# Patient Record
Sex: Male | Born: 2000 | Race: White | Hispanic: No | Marital: Single | Smoking: Never smoker
Health system: Southern US, Community
[De-identification: ages and names within clinical notes are randomized; demographics above are authoritative.]

## PROBLEM LIST (undated history)

## (undated) DIAGNOSIS — F913 Oppositional defiant disorder: Secondary | ICD-10-CM

## (undated) DIAGNOSIS — F988 Other specified behavioral and emotional disorders with onset usually occurring in childhood and adolescence: Secondary | ICD-10-CM

## (undated) HISTORY — DX: Other specified behavioral and emotional disorders with onset usually occurring in childhood and adolescence: F98.8

## (undated) HISTORY — DX: Oppositional defiant disorder: F91.3

---
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
## 2019-05-03 DIAGNOSIS — H5213 Myopia, bilateral: Secondary | ICD-10-CM | POA: Diagnosis not present

## 2019-08-21 DIAGNOSIS — H1045 Other chronic allergic conjunctivitis: Secondary | ICD-10-CM | POA: Diagnosis not present

## 2019-09-07 ENCOUNTER — Ambulatory Visit (INDEPENDENT_AMBULATORY_CARE_PROVIDER_SITE_OTHER): Payer: Medicaid Other | Admitting: Nurse Practitioner

## 2019-09-07 ENCOUNTER — Other Ambulatory Visit: Payer: Self-pay

## 2019-09-07 ENCOUNTER — Encounter: Payer: Self-pay | Admitting: Nurse Practitioner

## 2019-09-07 VITALS — BP 132/77 | HR 72 | Temp 98.2°F | Resp 20 | Ht 64.0 in | Wt 150.0 lb

## 2019-09-07 DIAGNOSIS — K4090 Unilateral inguinal hernia, without obstruction or gangrene, not specified as recurrent: Secondary | ICD-10-CM

## 2019-09-07 NOTE — Progress Notes (Signed)
   Subjective:    Patient ID: Samuel Evans, male    DOB: 2000/10/09, 19 y.o.   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 Now has a bulge in right groin area.   Review of Systems  Constitutional: Negative for diaphoresis.  Eyes: Negative for pain.  Respiratory: Negative for shortness of breath.   Cardiovascular: Negative for chest pain, palpitations and leg swelling.  Gastrointestinal: Negative for abdominal pain.  Endocrine: Negative for polydipsia.  Skin: Negative for rash.  Neurological: Negative for dizziness, weakness and headaches.  Hematological: Does not bruise/bleed easily.  All other systems reviewed and are negative.      Objective:   Physical Exam Vitals and nursing note reviewed.  Constitutional:      Appearance: Normal appearance.  Cardiovascular:     Rate and Rhythm: Normal rate and regular rhythm.     Heart sounds: Normal heart sounds.  Pulmonary:     Breath sounds: Normal breath sounds.  Genitourinary:    Comments: Right testicle enlargement  Non reducible right inguinal hernia. Skin:    General: Skin is warm.  Neurological:     General: No focal deficit present.     Mental Status: He is alert and oriented to person, place, and time.  Psychiatric:        Mood and Affect: Mood normal.        Behavior: Behavior normal.    BP 132/77   Pulse 72   Temp 98.2 F (36.8 C) (Temporal)   Resp 20   Ht 5\' 4"  (1.626 m)   Wt 150 lb (68 kg)   SpO2 99%   BMI 25.75 kg/m         Assessment & Plan:  Samuel Evans in today with chief complaint of Possible hernia in groin area   1. Right inguinal hernia If starts hurting to the ER ASAP No heavy lifitng    The above assessment and management plan was discussed with the patient. The patient verbalized understanding of and has agreed to the management plan. Patient  is aware to call the clinic if symptoms persist or worsen. Patient is aware when to return to the clinic for a follow-up visit. Patient educated on when it is appropriate to go to the emergency department.   Mary-Margaret Sonnie Alamo, FNP

## 2019-09-07 NOTE — Patient Instructions (Signed)
° °Inguinal Hernia, Adult °An inguinal hernia is when fat or your intestines push through a weak spot in a muscle where your leg meets your lower belly (groin). This causes a rounded lump (bulge). This kind of hernia could also be: °· In your scrotum, if you are male. °· In folds of skin around your vagina, if you are male. °There are three types of inguinal hernias. These include: °· Hernias that can be pushed back into the belly (are reducible). This type rarely causes pain. °· Hernias that cannot be pushed back into the belly (are incarcerated). °· Hernias that cannot be pushed back into the belly and lose their blood supply (are strangulated). This type needs emergency surgery. °If you do not have symptoms, you may not need treatment. If you have symptoms or a large hernia, you may need surgery. °Follow these instructions at home: °Lifestyle °· Do these things if told by your doctor so you do not have trouble pooping (constipation): °? Drink enough fluid to keep your pee (urine) pale yellow. °? Eat foods that have a lot of fiber. These include fresh fruits and vegetables, whole grains, and beans. °? Limit foods that are high in fat and processed sugars. These include foods that are fried or sweet. °? Take medicine for trouble pooping. °· Avoid lifting heavy objects. °· Avoid standing for long amounts of time. °· Do not use any products that contain nicotine or tobacco. These include cigarettes and e-cigarettes. If you need help quitting, ask your doctor. °· Stay at a healthy weight. °General instructions °· You may try to push your hernia in by very gently pressing on it when you are lying down. Do not try to force the bulge back in if it will not push in easily. °· Watch your hernia for any changes in shape, size, or color. Tell your doctor if you see any changes. °· Take over-the-counter and prescription medicines only as told by your doctor. °· Keep all follow-up visits as told by your doctor. This is  important. °Contact a doctor if: °· You have a fever. °· You have new symptoms. °· Your symptoms get worse. °Get help right away if: °· The area where your leg meets your lower belly has: °? Pain that gets worse suddenly. °? A bulge that gets bigger suddenly, and it does not get smaller after that. °? A bulge that turns red or purple. °? A bulge that is painful when you touch it. °· You are a man, and your scrotum: °? Suddenly feels painful. °? Suddenly changes in size. °· You cannot push the hernia in by very gently pressing on it when you are lying down. Do not try to force the bulge back in if it will not push in easily. °· You feel sick to your stomach (nauseous), and that feeling does not go away. °· You throw up (vomit), and that keeps happening. °· You have a fast heartbeat. °· You cannot poop (have a bowel movement) or pass gas. °These symptoms may be an emergency. Do not wait to see if the symptoms will go away. Get medical help right away. Call your local emergency services (911 in the [place] °Summary °· An inguinal hernia is when fat or your intestines push through a weak spot in a muscle where your leg meets your lower belly (groin). This causes a rounded lump (bulge). °· If you do not have symptoms, you may not need treatment. If you have symptoms or a large hernia,   you may need surgery. °· Avoid lifting heavy objects. Also avoid standing for long amounts of time. °· Do not try to force the bulge back in if it will not push in easily. °This information is not intended to replace advice given to you by your health care provider. Make sure you discuss any questions you have with your health care provider. °Document Revised: 03/30/2017 Document Reviewed: 11/28/2016 °Elsevier Patient Education © 2020 [employer] ° °

## 2019-09-10 ENCOUNTER — Other Ambulatory Visit: Payer: Self-pay | Admitting: Nurse Practitioner

## 2019-09-10 ENCOUNTER — Ambulatory Visit: Payer: Self-pay | Admitting: General Surgery

## 2019-09-10 DIAGNOSIS — K4090 Unilateral inguinal hernia, without obstruction or gangrene, not specified as recurrent: Secondary | ICD-10-CM

## 2019-12-16 ENCOUNTER — Telehealth: Payer: Self-pay | Admitting: Nurse Practitioner

## 2019-12-16 NOTE — Telephone Encounter (Signed)
REFERRAL REQUEST Telephone Note  Have you been seen at our office for this problem? Yes (Advise that they may need an appointment with their PCP before a referral can be done)  Reason for Referral: Hernia  Referral discussed with patient: Yes Best contact number of patient for referral team:    Has patient been seen by a specialist for this issue before: Yes Patient provider preference for referral: Patient has current Ref with [facility] - It is currently in Review where elective surgeries were postponed at the Hospital's - Patient would like His Ref sent to [facility] - I Spoke with [facility] and they will be able to get Patient in in the next couple of weeks. Patient location preference for referral: [place]   Patient notified that referrals can take up to a week or longer to process. If they haven't heard anything within a week they should call back and speak with the referral department.

## 2019-12-17 ENCOUNTER — Other Ambulatory Visit: Payer: Self-pay | Admitting: Nurse Practitioner

## 2019-12-17 DIAGNOSIS — K4090 Unilateral inguinal hernia, without obstruction or gangrene, not specified as recurrent: Secondary | ICD-10-CM

## 2019-12-17 NOTE — Telephone Encounter (Signed)
New referreal done

## 2019-12-25 DIAGNOSIS — K4090 Unilateral inguinal hernia, without obstruction or gangrene, not specified as recurrent: Secondary | ICD-10-CM | POA: Diagnosis not present

## 2020-02-17 DIAGNOSIS — Z20822 Contact with and (suspected) exposure to covid-19: Secondary | ICD-10-CM | POA: Diagnosis not present

## 2020-02-17 DIAGNOSIS — Z01812 Encounter for preprocedural laboratory examination: Secondary | ICD-10-CM | POA: Diagnosis not present

## 2020-02-19 DIAGNOSIS — Z881 Allergy status to other antibiotic agents status: Secondary | ICD-10-CM | POA: Diagnosis not present

## 2020-02-19 DIAGNOSIS — K4090 Unilateral inguinal hernia, without obstruction or gangrene, not specified as recurrent: Secondary | ICD-10-CM | POA: Diagnosis not present

## 2020-05-15 DIAGNOSIS — H5213 Myopia, bilateral: Secondary | ICD-10-CM | POA: Diagnosis not present

## 2022-05-03 ENCOUNTER — Ambulatory Visit: Payer: Medicaid Other | Admitting: Nurse Practitioner

## 2022-05-04 ENCOUNTER — Ambulatory Visit (INDEPENDENT_AMBULATORY_CARE_PROVIDER_SITE_OTHER): Payer: Medicaid Other | Admitting: Nurse Practitioner

## 2022-05-04 ENCOUNTER — Encounter: Payer: Self-pay | Admitting: Nurse Practitioner

## 2022-05-04 VITALS — BP 132/76 | HR 63 | Temp 98.2°F | Resp 20 | Ht 64.0 in | Wt 194.0 lb

## 2022-05-04 DIAGNOSIS — K219 Gastro-esophageal reflux disease without esophagitis: Secondary | ICD-10-CM | POA: Diagnosis not present

## 2022-05-04 MED ORDER — OMEPRAZOLE 40 MG PO CPDR: 40.0000 mg | DELAYED_RELEASE_CAPSULE | Freq: Every day | ORAL | 3 refills | Status: DC

## 2022-05-04 NOTE — Progress Notes (Signed)
   Subjective:    Patient ID: Samuel Evans, male    DOB: August 13, 2000, 22 y.o.   MRN: [id number]   Chief Complaint: Gastroesophageal Reflux   Gastroesophageal Reflux He complains of heartburn. He reports no abdominal pain or no chest pain. This is a recurrent problem. The problem occurs frequently. The heartburn duration is more than one hour. The heartburn is located in the substernum. The heartburn wakes him from sleep. The heartburn does not limit his activity. Treatments tried: TUMS.    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  Eyes:  Negative for pain.  Respiratory:  Negative for shortness of breath.   Cardiovascular:  Negative for chest pain, palpitations and leg swelling.  Gastrointestinal:  Positive for heartburn. Negative for abdominal pain.  Endocrine: Negative for polydipsia.  Skin:  Negative for rash.  Neurological:  Negative for dizziness, weakness and headaches.  Hematological:  Does not bruise/bleed easily.  All other systems reviewed and are negative.      Objective:   Physical Exam Constitutional:      Appearance: Normal appearance.  Cardiovascular:     Rate and Rhythm: Normal rate and regular rhythm.     Heart sounds: Normal heart sounds.  Pulmonary:     Effort: Pulmonary effort is normal.     Breath sounds: Normal breath sounds.  Skin:    General: Skin is warm.  Neurological:     General: No focal deficit present.     Mental Status: He is alert and oriented to person, place, and time.  Psychiatric:        Mood and Affect: Mood normal.        Behavior: Behavior normal.    BP 132/76   Pulse 63   Temp 98.2 F (36.8 C) (Temporal)   Resp 20   Ht 5' 4"$  (1.626 m)   Wt 194 lb (88 kg)   SpO2 100%   BMI 33.30  kg/m          Assessment & Plan:   DANUEL BURKES in today with chief complaint of Gastroesophageal Reflux   1. Gastroesophageal reflux disease without esophagitis Avoid spicy foods Do not eat 2 hours prior to bedtime  - omeprazole (PRILOSEC) 40 MG capsule; Take 1 capsule (40 mg total) by mouth daily.  Dispense: 30 capsule; Refill: 3    The above assessment and management plan was discussed with the patient. The patient verbalized understanding of and has agreed to the management plan. Patient is aware to call the clinic if symptoms persist or worsen. Patient is aware when to return to the clinic for a follow-up visit. Patient educated on when it is appropriate to go to the emergency department.   Mary-Margaret Hassell Done, FNP

## 2022-05-04 NOTE — Patient Instructions (Signed)

## 2022-05-17 ENCOUNTER — Ambulatory Visit: Payer: Medicaid Other | Admitting: Family Medicine

## 2022-09-03 ENCOUNTER — Other Ambulatory Visit: Payer: Self-pay | Admitting: Nurse Practitioner

## 2022-09-03 DIAGNOSIS — K219 Gastro-esophageal reflux disease without esophagitis: Secondary | ICD-10-CM

## 2022-10-21 ENCOUNTER — Other Ambulatory Visit: Payer: Self-pay | Admitting: Nurse Practitioner

## 2022-10-21 DIAGNOSIS — K219 Gastro-esophageal reflux disease without esophagitis: Secondary | ICD-10-CM

## 2022-11-24 ENCOUNTER — Other Ambulatory Visit: Payer: Self-pay | Admitting: Nurse Practitioner

## 2022-11-24 DIAGNOSIS — K219 Gastro-esophageal reflux disease without esophagitis: Secondary | ICD-10-CM

## 2023-04-18 DIAGNOSIS — H5213 Myopia, bilateral: Secondary | ICD-10-CM | POA: Diagnosis not present

## 2023-12-22 ENCOUNTER — Other Ambulatory Visit: Payer: Self-pay | Admitting: Nurse Practitioner

## 2023-12-22 DIAGNOSIS — K219 Gastro-esophageal reflux disease without esophagitis: Secondary | ICD-10-CM
